# Patient Record
Sex: Female | Born: 1960 | Race: White | Hispanic: No | Marital: Single | Smoking: Never smoker
Health system: Southern US, Community
[De-identification: ages and names within clinical notes are randomized; demographics above are authoritative.]

---

## 1997-08-03 ENCOUNTER — Other Ambulatory Visit: Admission: RE | Admit: 1997-08-03 | Discharge: 1997-08-03 | Payer: Self-pay | Admitting: Dermatology

## 1997-12-10 ENCOUNTER — Other Ambulatory Visit: Admission: RE | Admit: 1997-12-10 | Discharge: 1997-12-10 | Payer: Self-pay | Admitting: *Deleted

## 1999-02-06 ENCOUNTER — Other Ambulatory Visit: Admission: RE | Admit: 1999-02-06 | Discharge: 1999-02-06 | Payer: Self-pay | Admitting: *Deleted

## 2000-01-11 ENCOUNTER — Encounter: Admission: RE | Admit: 2000-01-11 | Discharge: 2000-01-11 | Payer: Self-pay | Admitting: *Deleted

## 2000-01-11 ENCOUNTER — Encounter: Payer: Self-pay | Admitting: *Deleted

## 2000-03-08 ENCOUNTER — Other Ambulatory Visit: Admission: RE | Admit: 2000-03-08 | Discharge: 2000-03-08 | Payer: Self-pay | Admitting: *Deleted

## 2001-01-15 ENCOUNTER — Encounter: Admission: RE | Admit: 2001-01-15 | Discharge: 2001-01-15 | Payer: Self-pay | Admitting: *Deleted

## 2001-01-15 ENCOUNTER — Encounter: Payer: Self-pay | Admitting: *Deleted

## 2001-03-19 ENCOUNTER — Other Ambulatory Visit: Admission: RE | Admit: 2001-03-19 | Discharge: 2001-03-19 | Payer: Self-pay | Admitting: *Deleted

## 2002-03-04 ENCOUNTER — Encounter: Payer: Self-pay | Admitting: *Deleted

## 2002-03-04 ENCOUNTER — Encounter: Admission: RE | Admit: 2002-03-04 | Discharge: 2002-03-04 | Payer: Self-pay | Admitting: *Deleted

## 2002-03-26 ENCOUNTER — Other Ambulatory Visit: Admission: RE | Admit: 2002-03-26 | Discharge: 2002-03-26 | Payer: Self-pay | Admitting: *Deleted

## 2003-03-16 ENCOUNTER — Encounter: Admission: RE | Admit: 2003-03-16 | Discharge: 2003-03-16 | Payer: Self-pay | Admitting: *Deleted

## 2004-05-05 ENCOUNTER — Encounter: Admission: RE | Admit: 2004-05-05 | Discharge: 2004-05-05 | Payer: Self-pay | Admitting: *Deleted

## 2004-05-11 ENCOUNTER — Other Ambulatory Visit: Admission: RE | Admit: 2004-05-11 | Discharge: 2004-05-11 | Payer: Self-pay | Admitting: *Deleted

## 2005-05-31 ENCOUNTER — Other Ambulatory Visit: Admission: RE | Admit: 2005-05-31 | Discharge: 2005-05-31 | Payer: Self-pay | Admitting: Obstetrics and Gynecology

## 2005-06-18 ENCOUNTER — Encounter: Admission: RE | Admit: 2005-06-18 | Discharge: 2005-06-18 | Payer: Self-pay | Admitting: Obstetrics & Gynecology

## 2006-06-25 ENCOUNTER — Encounter: Admission: RE | Admit: 2006-06-25 | Discharge: 2006-06-25 | Payer: Self-pay | Admitting: Obstetrics & Gynecology

## 2007-07-18 ENCOUNTER — Encounter: Admission: RE | Admit: 2007-07-18 | Discharge: 2007-07-18 | Payer: Self-pay | Admitting: Obstetrics & Gynecology

## 2007-08-27 ENCOUNTER — Other Ambulatory Visit: Admission: RE | Admit: 2007-08-27 | Discharge: 2007-08-27 | Payer: Self-pay | Admitting: Obstetrics & Gynecology

## 2008-09-06 ENCOUNTER — Encounter: Admission: RE | Admit: 2008-09-06 | Discharge: 2008-09-06 | Payer: Self-pay | Admitting: Obstetrics & Gynecology

## 2008-09-10 ENCOUNTER — Encounter: Admission: RE | Admit: 2008-09-10 | Discharge: 2008-09-10 | Payer: Self-pay | Admitting: Obstetrics & Gynecology

## 2009-10-12 ENCOUNTER — Encounter: Admission: RE | Admit: 2009-10-12 | Discharge: 2009-10-12 | Payer: Self-pay | Admitting: Obstetrics & Gynecology

## 2010-10-13 ENCOUNTER — Other Ambulatory Visit: Payer: Self-pay | Admitting: Obstetrics & Gynecology

## 2010-10-13 DIAGNOSIS — Z1231 Encounter for screening mammogram for malignant neoplasm of breast: Secondary | ICD-10-CM

## 2010-10-24 ENCOUNTER — Ambulatory Visit
Admission: RE | Admit: 2010-10-24 | Discharge: 2010-10-24 | Disposition: A | Discharge status: Home / Self Care | Payer: Medicare HMO | Source: Ambulatory Visit | Attending: Obstetrics & Gynecology | Admitting: Obstetrics & Gynecology

## 2010-10-24 DIAGNOSIS — Z1231 Encounter for screening mammogram for malignant neoplasm of breast: Secondary | ICD-10-CM

## 2010-11-07 ENCOUNTER — Other Ambulatory Visit: Payer: Self-pay | Admitting: Family Medicine

## 2010-11-07 DIAGNOSIS — N6313 Unspecified lump in the right breast, lower outer quadrant: Secondary | ICD-10-CM

## 2010-11-07 DIAGNOSIS — N6320 Unspecified lump in the left breast, unspecified quadrant: Secondary | ICD-10-CM

## 2010-11-10 ENCOUNTER — Ambulatory Visit
Admission: RE | Admit: 2010-11-10 | Discharge: 2010-11-10 | Disposition: A | Discharge status: Home / Self Care | Payer: Medicare HMO | Source: Ambulatory Visit | Attending: Family Medicine | Admitting: Family Medicine

## 2010-11-10 DIAGNOSIS — N6313 Unspecified lump in the right breast, lower outer quadrant: Secondary | ICD-10-CM

## 2010-11-10 DIAGNOSIS — N6320 Unspecified lump in the left breast, unspecified quadrant: Secondary | ICD-10-CM

## 2010-11-14 ENCOUNTER — Other Ambulatory Visit (HOSPITAL_COMMUNITY)
Admission: RE | Admit: 2010-11-14 | Discharge: 2010-11-14 | Disposition: A | Discharge status: Home / Self Care | Payer: Medicare HMO | Source: Ambulatory Visit | Attending: Family Medicine | Admitting: Family Medicine

## 2010-11-14 ENCOUNTER — Other Ambulatory Visit: Payer: Self-pay | Admitting: Family Medicine

## 2010-11-14 DIAGNOSIS — Z1159 Encounter for screening for other viral diseases: Secondary | ICD-10-CM | POA: Insufficient documentation

## 2010-11-14 DIAGNOSIS — Z124 Encounter for screening for malignant neoplasm of cervix: Secondary | ICD-10-CM | POA: Insufficient documentation

## 2011-06-13 ENCOUNTER — Other Ambulatory Visit: Payer: Self-pay | Admitting: Family Medicine

## 2011-06-13 DIAGNOSIS — N6310 Unspecified lump in the right breast, unspecified quadrant: Secondary | ICD-10-CM

## 2011-06-13 DIAGNOSIS — N6002 Solitary cyst of left breast: Secondary | ICD-10-CM

## 2011-06-19 ENCOUNTER — Ambulatory Visit
Admission: RE | Admit: 2011-06-19 | Discharge: 2011-06-19 | Disposition: A | Discharge status: Home / Self Care | Payer: Medicare HMO | Source: Ambulatory Visit | Attending: Family Medicine | Admitting: Family Medicine

## 2011-06-19 ENCOUNTER — Other Ambulatory Visit: Payer: Self-pay | Admitting: Family Medicine

## 2011-06-19 DIAGNOSIS — N6310 Unspecified lump in the right breast, unspecified quadrant: Secondary | ICD-10-CM

## 2011-06-19 DIAGNOSIS — N6002 Solitary cyst of left breast: Secondary | ICD-10-CM

## 2011-09-26 ENCOUNTER — Other Ambulatory Visit: Payer: Self-pay | Admitting: Family Medicine

## 2011-09-26 DIAGNOSIS — N6009 Solitary cyst of unspecified breast: Secondary | ICD-10-CM

## 2011-10-26 ENCOUNTER — Ambulatory Visit
Admission: RE | Admit: 2011-10-26 | Discharge: 2011-10-26 | Disposition: A | Discharge status: Home / Self Care | Payer: Medicare HMO | Source: Ambulatory Visit | Attending: Family Medicine | Admitting: Family Medicine

## 2011-10-26 DIAGNOSIS — N6009 Solitary cyst of unspecified breast: Secondary | ICD-10-CM

## 2012-05-05 ENCOUNTER — Other Ambulatory Visit: Payer: Self-pay | Admitting: Gastroenterology

## 2012-09-08 ENCOUNTER — Other Ambulatory Visit: Payer: Self-pay | Admitting: Dermatology

## 2012-10-20 ENCOUNTER — Other Ambulatory Visit: Payer: Self-pay | Admitting: Gastroenterology

## 2012-10-20 DIAGNOSIS — D649 Anemia, unspecified: Secondary | ICD-10-CM

## 2012-10-20 DIAGNOSIS — R109 Unspecified abdominal pain: Secondary | ICD-10-CM

## 2012-10-27 ENCOUNTER — Ambulatory Visit
Admission: RE | Admit: 2012-10-27 | Discharge: 2012-10-27 | Disposition: A | Discharge status: Home / Self Care | Payer: Medicare HMO | Source: Ambulatory Visit | Attending: Gastroenterology | Admitting: Gastroenterology

## 2012-10-27 DIAGNOSIS — R109 Unspecified abdominal pain: Secondary | ICD-10-CM

## 2012-10-27 DIAGNOSIS — D649 Anemia, unspecified: Secondary | ICD-10-CM

## 2012-10-27 MED ORDER — IOHEXOL 300 MG/ML  SOLN
100.0000 mL | Freq: Once | INTRAMUSCULAR | Status: AC | PRN
  Administered 2012-10-27: 100 mL via INTRAVENOUS

## 2012-11-12 ENCOUNTER — Other Ambulatory Visit: Payer: Self-pay | Admitting: Obstetrics and Gynecology

## 2012-11-12 DIAGNOSIS — D219 Benign neoplasm of connective and other soft tissue, unspecified: Secondary | ICD-10-CM

## 2012-11-18 ENCOUNTER — Other Ambulatory Visit: Payer: Medicare HMO

## 2012-11-23 ENCOUNTER — Ambulatory Visit
Admission: RE | Admit: 2012-11-23 | Discharge: 2012-11-23 | Disposition: A | Discharge status: Home / Self Care | Payer: Medicare HMO | Source: Ambulatory Visit | Attending: Obstetrics and Gynecology | Admitting: Obstetrics and Gynecology

## 2012-11-23 DIAGNOSIS — D219 Benign neoplasm of connective and other soft tissue, unspecified: Secondary | ICD-10-CM

## 2012-11-23 MED ORDER — GADOBENATE DIMEGLUMINE 529 MG/ML IV SOLN
14.0000 mL | Freq: Once | INTRAVENOUS | Status: AC | PRN
  Administered 2012-11-23: 14 mL via INTRAVENOUS

## 2012-12-17 ENCOUNTER — Other Ambulatory Visit: Payer: Self-pay

## 2012-12-17 DIAGNOSIS — Z1231 Encounter for screening mammogram for malignant neoplasm of breast: Secondary | ICD-10-CM

## 2012-12-23 ENCOUNTER — Other Ambulatory Visit: Payer: Self-pay | Admitting: Obstetrics and Gynecology

## 2012-12-23 DIAGNOSIS — D259 Leiomyoma of uterus, unspecified: Secondary | ICD-10-CM

## 2012-12-24 ENCOUNTER — Ambulatory Visit
Admission: RE | Admit: 2012-12-24 | Discharge: 2012-12-24 | Disposition: A | Discharge status: Home / Self Care | Payer: Medicare HMO | Source: Ambulatory Visit | Attending: Obstetrics and Gynecology | Admitting: Obstetrics and Gynecology

## 2012-12-24 DIAGNOSIS — D259 Leiomyoma of uterus, unspecified: Secondary | ICD-10-CM

## 2013-01-16 ENCOUNTER — Ambulatory Visit: Payer: Medicare HMO

## 2013-01-19 ENCOUNTER — Ambulatory Visit
Admission: RE | Admit: 2013-01-19 | Discharge: 2013-01-19 | Disposition: A | Discharge status: Home / Self Care | Payer: Self-pay | Source: Ambulatory Visit

## 2013-01-19 DIAGNOSIS — Z1231 Encounter for screening mammogram for malignant neoplasm of breast: Secondary | ICD-10-CM

## 2013-11-27 ENCOUNTER — Other Ambulatory Visit: Payer: Self-pay | Admitting: Obstetrics and Gynecology

## 2013-11-27 ENCOUNTER — Other Ambulatory Visit (HOSPITAL_COMMUNITY)
Admission: RE | Admit: 2013-11-27 | Discharge: 2013-11-27 | Disposition: A | Discharge status: Home / Self Care | Payer: Medicare HMO | Source: Ambulatory Visit | Attending: Obstetrics and Gynecology | Admitting: Obstetrics and Gynecology

## 2013-11-27 DIAGNOSIS — Z124 Encounter for screening for malignant neoplasm of cervix: Secondary | ICD-10-CM | POA: Insufficient documentation

## 2013-11-27 DIAGNOSIS — Z1151 Encounter for screening for human papillomavirus (HPV): Secondary | ICD-10-CM | POA: Insufficient documentation

## 2013-12-01 LAB — CYTOLOGY - PAP

## 2013-12-21 ENCOUNTER — Other Ambulatory Visit: Payer: Self-pay | Admitting: Obstetrics and Gynecology

## 2013-12-21 DIAGNOSIS — N83202 Unspecified ovarian cyst, left side: Secondary | ICD-10-CM

## 2013-12-21 DIAGNOSIS — D250 Submucous leiomyoma of uterus: Secondary | ICD-10-CM

## 2013-12-26 ENCOUNTER — Ambulatory Visit
Admission: RE | Admit: 2013-12-26 | Discharge: 2013-12-26 | Disposition: A | Discharge status: Home / Self Care | Payer: Medicare HMO | Source: Ambulatory Visit | Attending: Obstetrics and Gynecology | Admitting: Obstetrics and Gynecology

## 2013-12-26 DIAGNOSIS — D250 Submucous leiomyoma of uterus: Secondary | ICD-10-CM

## 2013-12-26 DIAGNOSIS — N83202 Unspecified ovarian cyst, left side: Secondary | ICD-10-CM

## 2013-12-26 MED ORDER — GADOBENATE DIMEGLUMINE 529 MG/ML IV SOLN
14.0000 mL | Freq: Once | INTRAVENOUS | Status: AC | PRN
  Administered 2013-12-26: 14 mL via INTRAVENOUS

## 2014-01-15 ENCOUNTER — Other Ambulatory Visit: Payer: Self-pay

## 2014-01-15 DIAGNOSIS — Z1231 Encounter for screening mammogram for malignant neoplasm of breast: Secondary | ICD-10-CM

## 2014-02-17 ENCOUNTER — Encounter (INDEPENDENT_AMBULATORY_CARE_PROVIDER_SITE_OTHER): Payer: Self-pay

## 2014-02-17 ENCOUNTER — Ambulatory Visit
Admission: RE | Admit: 2014-02-17 | Discharge: 2014-02-17 | Disposition: A | Discharge status: Home / Self Care | Payer: Commercial Managed Care - HMO | Source: Ambulatory Visit

## 2014-02-17 DIAGNOSIS — Z1231 Encounter for screening mammogram for malignant neoplasm of breast: Secondary | ICD-10-CM

## 2014-04-28 DIAGNOSIS — H1013 Acute atopic conjunctivitis, bilateral: Secondary | ICD-10-CM | POA: Diagnosis not present

## 2014-11-29 ENCOUNTER — Other Ambulatory Visit: Payer: Self-pay | Admitting: Obstetrics and Gynecology

## 2014-11-29 DIAGNOSIS — N63 Unspecified lump in breast: Secondary | ICD-10-CM | POA: Diagnosis not present

## 2014-11-29 DIAGNOSIS — N6320 Unspecified lump in the left breast, unspecified quadrant: Secondary | ICD-10-CM

## 2014-11-29 DIAGNOSIS — Z01411 Encounter for gynecological examination (general) (routine) with abnormal findings: Secondary | ICD-10-CM | POA: Diagnosis not present

## 2014-11-29 DIAGNOSIS — D259 Leiomyoma of uterus, unspecified: Secondary | ICD-10-CM | POA: Diagnosis not present

## 2014-12-03 ENCOUNTER — Ambulatory Visit
Admission: RE | Admit: 2014-12-03 | Discharge: 2014-12-03 | Disposition: A | Discharge status: Home / Self Care | Payer: Commercial Managed Care - HMO | Source: Ambulatory Visit | Attending: Obstetrics and Gynecology | Admitting: Obstetrics and Gynecology

## 2014-12-03 DIAGNOSIS — N6320 Unspecified lump in the left breast, unspecified quadrant: Secondary | ICD-10-CM

## 2014-12-03 DIAGNOSIS — N6012 Diffuse cystic mastopathy of left breast: Secondary | ICD-10-CM | POA: Diagnosis not present

## 2014-12-07 DIAGNOSIS — D259 Leiomyoma of uterus, unspecified: Secondary | ICD-10-CM | POA: Diagnosis not present

## 2015-02-18 ENCOUNTER — Other Ambulatory Visit: Payer: Self-pay

## 2015-02-18 ENCOUNTER — Other Ambulatory Visit: Payer: Self-pay | Admitting: Obstetrics and Gynecology

## 2015-02-18 DIAGNOSIS — Z1231 Encounter for screening mammogram for malignant neoplasm of breast: Secondary | ICD-10-CM

## 2015-03-25 ENCOUNTER — Ambulatory Visit: Payer: Commercial Managed Care - HMO

## 2015-04-22 ENCOUNTER — Ambulatory Visit
Admission: RE | Admit: 2015-04-22 | Discharge: 2015-04-22 | Disposition: A | Discharge status: Home / Self Care | Payer: Commercial Managed Care - HMO | Source: Ambulatory Visit

## 2015-04-22 DIAGNOSIS — Z1231 Encounter for screening mammogram for malignant neoplasm of breast: Secondary | ICD-10-CM | POA: Diagnosis not present

## 2015-09-16 DIAGNOSIS — N751 Abscess of Bartholin's gland: Secondary | ICD-10-CM | POA: Diagnosis not present

## 2015-09-23 IMAGING — MG MM SCREEN MAMMOGRAM BILATERAL
6 series · 6 of 6 positions shown · non-contrast
Comparison: Previous exam(s).

CLINICAL DATA: Screening.

EXAM:
DIGITAL SCREENING BILATERAL MAMMOGRAM WITH CAD

[R CC (1 of 2)]
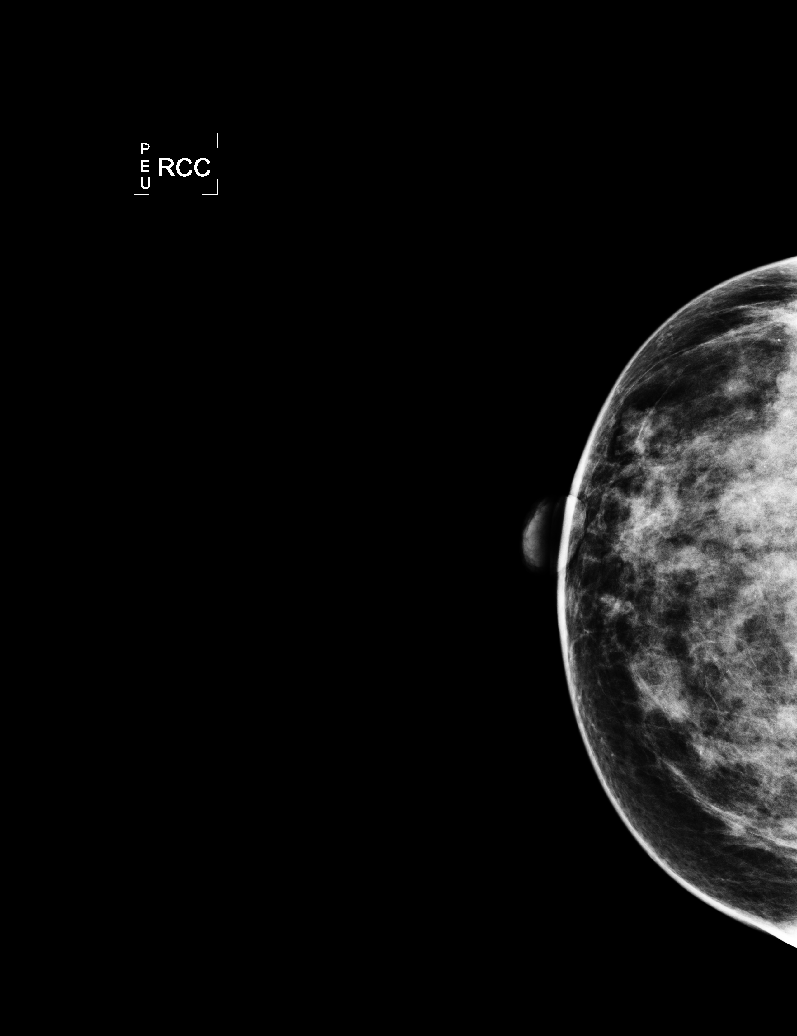

[L CC]
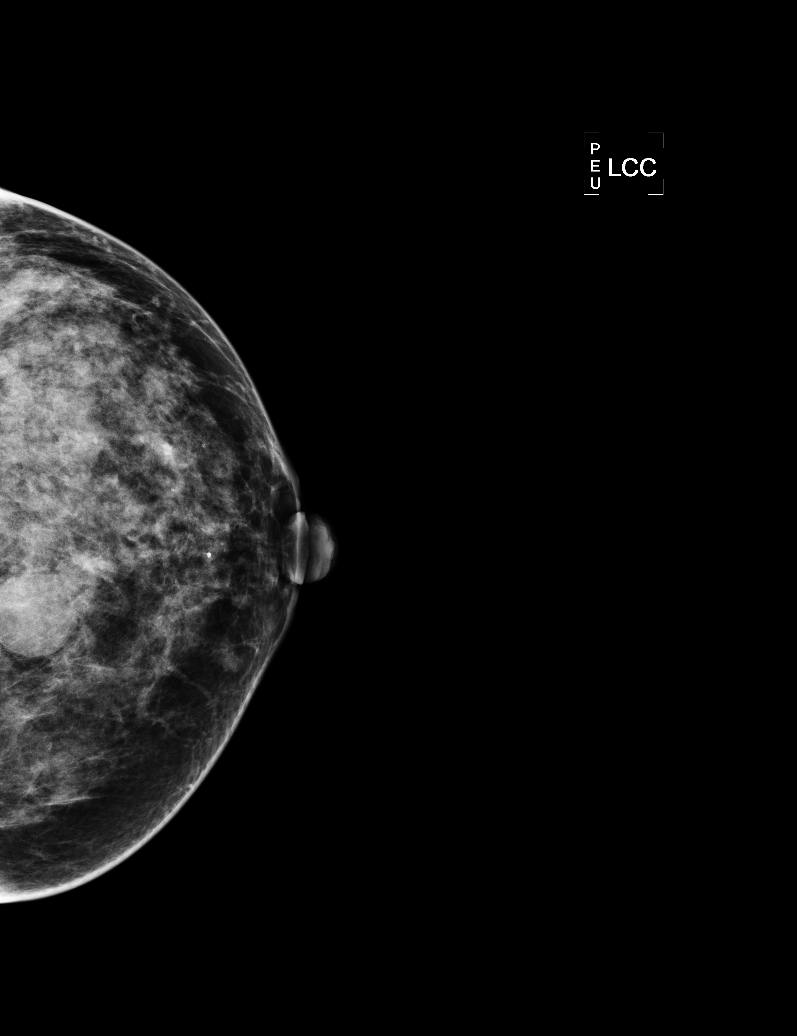

[L MLO (1 of 2)]
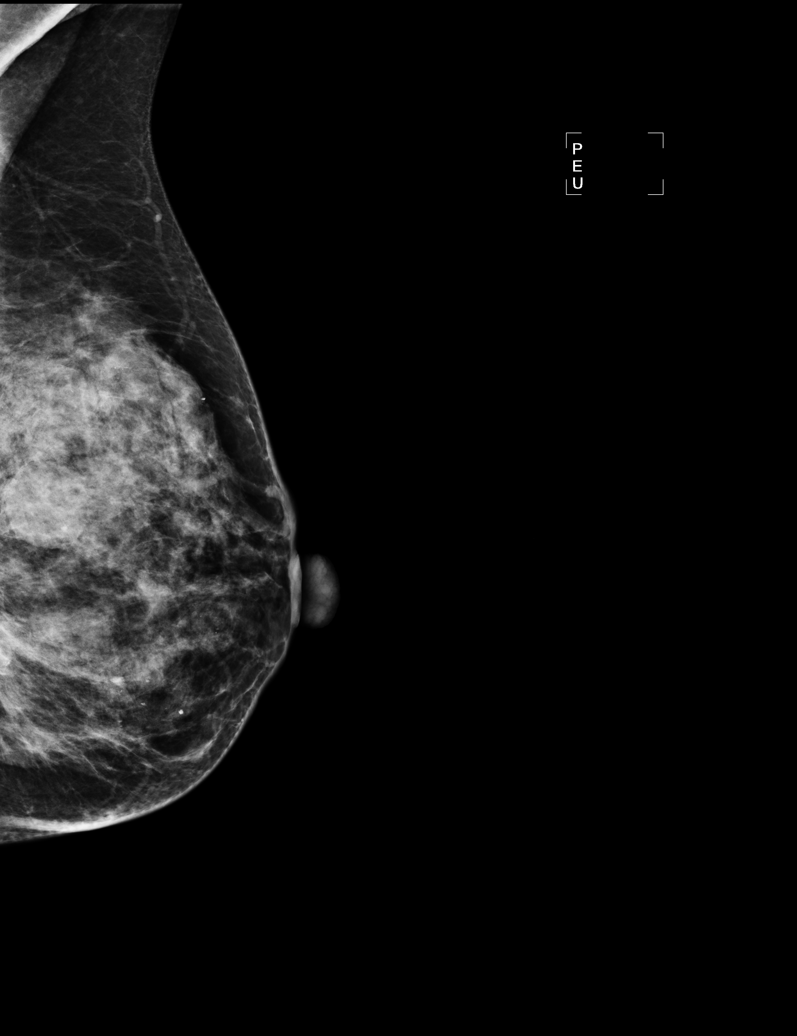

[R MLO]
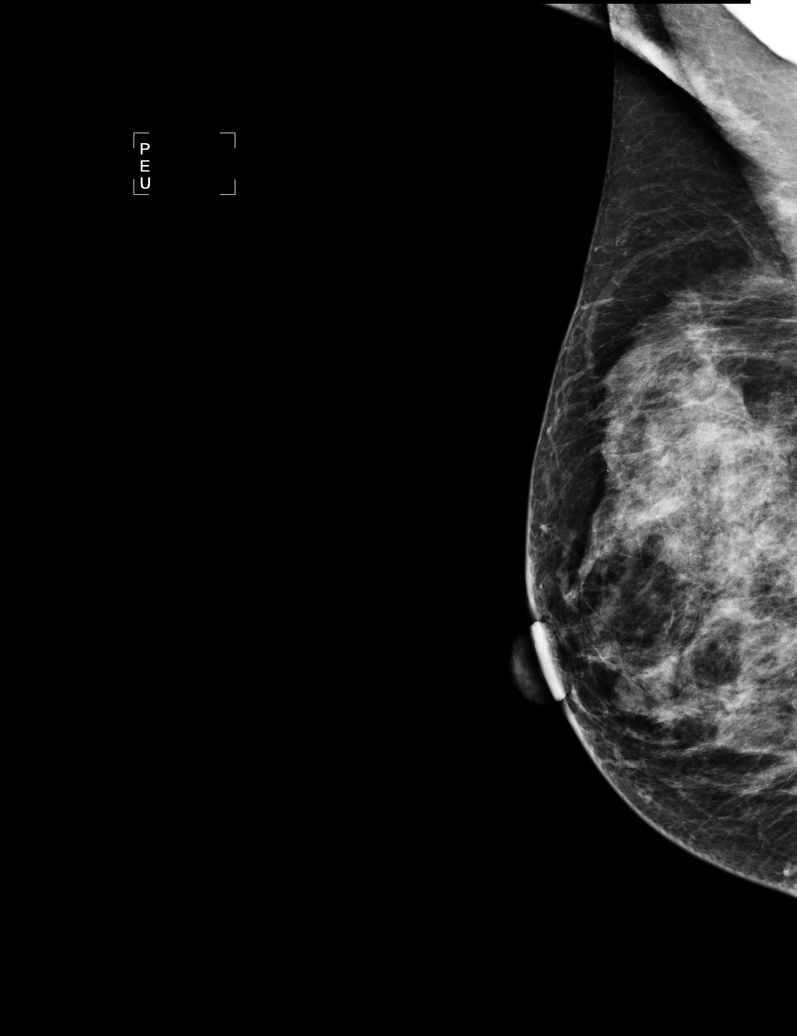

[R CC (2 of 2)]
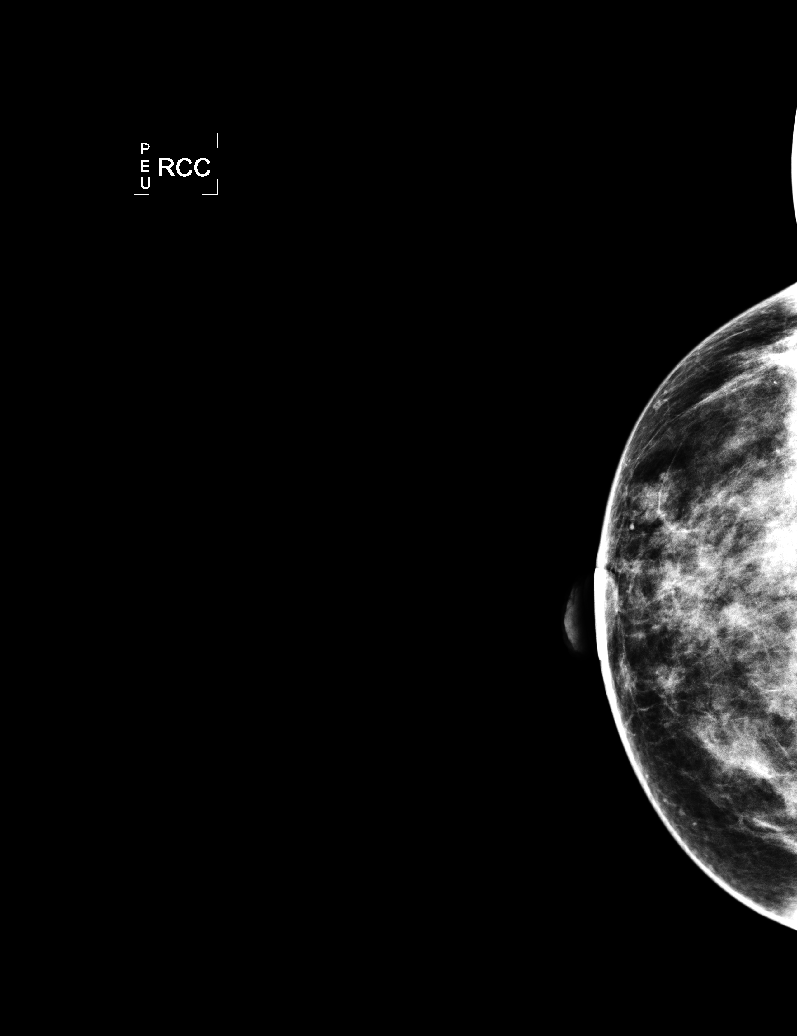

[L MLO (2 of 2)]
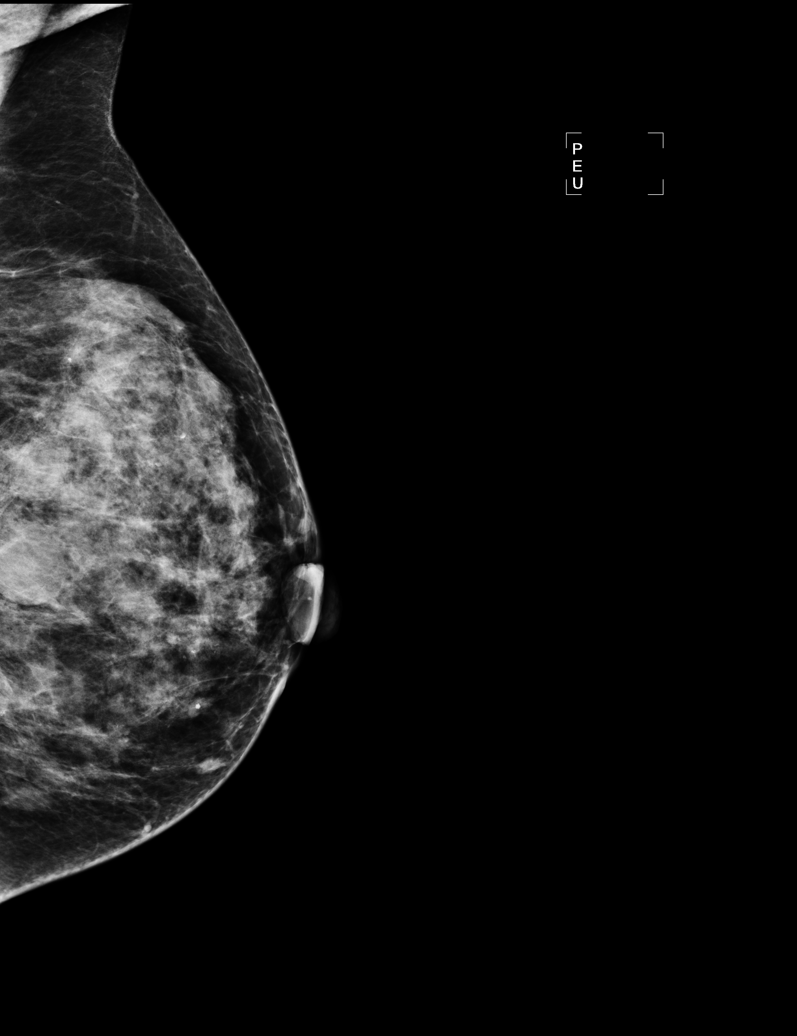

[6 of 6 positions shown; findings below may reference images not displayed]

ACR Breast Density Category c: The breasts are heterogeneously
dense, which may obscure small masses.
FINDINGS: There are no findings suspicious for malignancy. Images were
processed with CAD.
IMPRESSION: No mammographic evidence of malignancy. A result letter of this
screening mammogram will be mailed directly to the patient.

RECOMMENDATION:
Screening mammogram in one year. (Code:[id number]-8X6)

BI-RADS CATEGORY  1: Negative

## 2015-12-02 DIAGNOSIS — E559 Vitamin D deficiency, unspecified: Secondary | ICD-10-CM | POA: Diagnosis not present

## 2015-12-02 DIAGNOSIS — R21 Rash and other nonspecific skin eruption: Secondary | ICD-10-CM | POA: Diagnosis not present

## 2015-12-02 DIAGNOSIS — N951 Menopausal and female climacteric states: Secondary | ICD-10-CM | POA: Diagnosis not present

## 2015-12-02 DIAGNOSIS — D251 Intramural leiomyoma of uterus: Secondary | ICD-10-CM | POA: Diagnosis not present

## 2015-12-02 DIAGNOSIS — Z01411 Encounter for gynecological examination (general) (routine) with abnormal findings: Secondary | ICD-10-CM | POA: Diagnosis not present

## 2015-12-02 DIAGNOSIS — D252 Subserosal leiomyoma of uterus: Secondary | ICD-10-CM | POA: Diagnosis not present

## 2015-12-21 DIAGNOSIS — F840 Autistic disorder: Secondary | ICD-10-CM | POA: Diagnosis not present

## 2015-12-21 DIAGNOSIS — E559 Vitamin D deficiency, unspecified: Secondary | ICD-10-CM | POA: Diagnosis not present

## 2015-12-21 DIAGNOSIS — E7800 Pure hypercholesterolemia, unspecified: Secondary | ICD-10-CM | POA: Diagnosis not present

## 2015-12-28 ENCOUNTER — Other Ambulatory Visit: Payer: Self-pay | Admitting: Dermatology

## 2015-12-28 DIAGNOSIS — D235 Other benign neoplasm of skin of trunk: Secondary | ICD-10-CM | POA: Diagnosis not present

## 2015-12-28 DIAGNOSIS — D239 Other benign neoplasm of skin, unspecified: Secondary | ICD-10-CM | POA: Diagnosis not present

## 2015-12-28 DIAGNOSIS — L918 Other hypertrophic disorders of the skin: Secondary | ICD-10-CM | POA: Diagnosis not present

## 2015-12-28 DIAGNOSIS — D485 Neoplasm of uncertain behavior of skin: Secondary | ICD-10-CM | POA: Diagnosis not present

## 2016-02-24 DIAGNOSIS — E782 Mixed hyperlipidemia: Secondary | ICD-10-CM | POA: Diagnosis not present

## 2016-04-05 ENCOUNTER — Other Ambulatory Visit: Payer: Self-pay | Admitting: Obstetrics and Gynecology

## 2016-04-05 DIAGNOSIS — Z1231 Encounter for screening mammogram for malignant neoplasm of breast: Secondary | ICD-10-CM

## 2016-04-06 DIAGNOSIS — E559 Vitamin D deficiency, unspecified: Secondary | ICD-10-CM | POA: Diagnosis not present

## 2016-04-30 ENCOUNTER — Ambulatory Visit: Payer: Commercial Managed Care - HMO

## 2016-04-30 DIAGNOSIS — H10413 Chronic giant papillary conjunctivitis, bilateral: Secondary | ICD-10-CM | POA: Diagnosis not present

## 2016-05-02 ENCOUNTER — Ambulatory Visit
Admission: RE | Admit: 2016-05-02 | Discharge: 2016-05-02 | Disposition: A | Discharge status: Home / Self Care | Payer: Commercial Managed Care - HMO | Source: Ambulatory Visit | Attending: Obstetrics and Gynecology | Admitting: Obstetrics and Gynecology

## 2016-05-02 DIAGNOSIS — Z1231 Encounter for screening mammogram for malignant neoplasm of breast: Secondary | ICD-10-CM | POA: Diagnosis not present

## 2016-07-16 DIAGNOSIS — R69 Illness, unspecified: Secondary | ICD-10-CM | POA: Diagnosis not present

## 2016-12-04 ENCOUNTER — Other Ambulatory Visit: Payer: Self-pay | Admitting: Obstetrics and Gynecology

## 2016-12-04 ENCOUNTER — Other Ambulatory Visit (HOSPITAL_COMMUNITY)
Admission: RE | Admit: 2016-12-04 | Discharge: 2016-12-04 | Disposition: A | Discharge status: Home / Self Care | Payer: Medicare HMO | Source: Ambulatory Visit | Attending: Obstetrics and Gynecology | Admitting: Obstetrics and Gynecology

## 2016-12-04 DIAGNOSIS — Z01411 Encounter for gynecological examination (general) (routine) with abnormal findings: Secondary | ICD-10-CM | POA: Diagnosis not present

## 2016-12-04 DIAGNOSIS — Z124 Encounter for screening for malignant neoplasm of cervix: Secondary | ICD-10-CM | POA: Insufficient documentation

## 2016-12-04 DIAGNOSIS — N6320 Unspecified lump in the left breast, unspecified quadrant: Secondary | ICD-10-CM

## 2016-12-04 DIAGNOSIS — D252 Subserosal leiomyoma of uterus: Secondary | ICD-10-CM | POA: Diagnosis not present

## 2016-12-04 DIAGNOSIS — D251 Intramural leiomyoma of uterus: Secondary | ICD-10-CM | POA: Diagnosis not present

## 2016-12-04 DIAGNOSIS — N951 Menopausal and female climacteric states: Secondary | ICD-10-CM | POA: Diagnosis not present

## 2016-12-04 DIAGNOSIS — E559 Vitamin D deficiency, unspecified: Secondary | ICD-10-CM | POA: Diagnosis not present

## 2016-12-06 ENCOUNTER — Ambulatory Visit
Admission: RE | Admit: 2016-12-06 | Discharge: 2016-12-06 | Disposition: A | Discharge status: Home / Self Care | Payer: Medicare HMO | Source: Ambulatory Visit | Attending: Obstetrics and Gynecology | Admitting: Obstetrics and Gynecology

## 2016-12-06 ENCOUNTER — Other Ambulatory Visit: Payer: Commercial Managed Care - HMO

## 2016-12-06 DIAGNOSIS — N6321 Unspecified lump in the left breast, upper outer quadrant: Secondary | ICD-10-CM | POA: Diagnosis not present

## 2016-12-06 DIAGNOSIS — N6320 Unspecified lump in the left breast, unspecified quadrant: Secondary | ICD-10-CM

## 2016-12-06 DIAGNOSIS — R922 Inconclusive mammogram: Secondary | ICD-10-CM | POA: Diagnosis not present

## 2016-12-06 LAB — CYTOLOGY - PAP
Diagnosis: NEGATIVE
HPV: NOT DETECTED

## 2017-01-23 DIAGNOSIS — Z780 Asymptomatic menopausal state: Secondary | ICD-10-CM | POA: Diagnosis not present

## 2017-04-02 ENCOUNTER — Other Ambulatory Visit: Payer: Self-pay | Admitting: Obstetrics and Gynecology

## 2017-04-02 DIAGNOSIS — Z1231 Encounter for screening mammogram for malignant neoplasm of breast: Secondary | ICD-10-CM

## 2017-04-08 DIAGNOSIS — S4992XA Unspecified injury of left shoulder and upper arm, initial encounter: Secondary | ICD-10-CM | POA: Diagnosis not present

## 2017-05-03 ENCOUNTER — Ambulatory Visit: Payer: Medicare HMO

## 2017-05-17 ENCOUNTER — Ambulatory Visit
Admission: RE | Admit: 2017-05-17 | Discharge: 2017-05-17 | Disposition: A | Discharge status: Home / Self Care | Payer: Medicare HMO | Source: Ambulatory Visit | Attending: Obstetrics and Gynecology | Admitting: Obstetrics and Gynecology

## 2017-05-17 DIAGNOSIS — Z1231 Encounter for screening mammogram for malignant neoplasm of breast: Secondary | ICD-10-CM | POA: Diagnosis not present

## 2017-10-16 DIAGNOSIS — Z86010 Personal history of colonic polyps: Secondary | ICD-10-CM | POA: Diagnosis not present

## 2017-12-13 DIAGNOSIS — N904 Leukoplakia of vulva: Secondary | ICD-10-CM | POA: Diagnosis not present

## 2017-12-13 DIAGNOSIS — Z01411 Encounter for gynecological examination (general) (routine) with abnormal findings: Secondary | ICD-10-CM | POA: Diagnosis not present

## 2017-12-13 DIAGNOSIS — N898 Other specified noninflammatory disorders of vagina: Secondary | ICD-10-CM | POA: Diagnosis not present

## 2017-12-30 ENCOUNTER — Other Ambulatory Visit: Payer: Self-pay | Admitting: Obstetrics and Gynecology

## 2017-12-30 DIAGNOSIS — N904 Leukoplakia of vulva: Secondary | ICD-10-CM | POA: Diagnosis not present

## 2018-02-04 DIAGNOSIS — E7800 Pure hypercholesterolemia, unspecified: Secondary | ICD-10-CM | POA: Diagnosis not present

## 2018-02-04 DIAGNOSIS — Z79899 Other long term (current) drug therapy: Secondary | ICD-10-CM | POA: Diagnosis not present

## 2018-03-11 DIAGNOSIS — L900 Lichen sclerosus et atrophicus: Secondary | ICD-10-CM | POA: Diagnosis not present

## 2018-04-07 DIAGNOSIS — E7800 Pure hypercholesterolemia, unspecified: Secondary | ICD-10-CM | POA: Diagnosis not present

## 2018-04-07 DIAGNOSIS — Z1159 Encounter for screening for other viral diseases: Secondary | ICD-10-CM | POA: Diagnosis not present

## 2018-04-07 DIAGNOSIS — Z0000 Encounter for general adult medical examination without abnormal findings: Secondary | ICD-10-CM | POA: Diagnosis not present

## 2018-04-15 ENCOUNTER — Other Ambulatory Visit: Payer: Self-pay | Admitting: Obstetrics and Gynecology

## 2018-04-15 DIAGNOSIS — Z1231 Encounter for screening mammogram for malignant neoplasm of breast: Secondary | ICD-10-CM

## 2018-05-01 DIAGNOSIS — H524 Presbyopia: Secondary | ICD-10-CM | POA: Diagnosis not present

## 2018-05-01 DIAGNOSIS — H10413 Chronic giant papillary conjunctivitis, bilateral: Secondary | ICD-10-CM | POA: Diagnosis not present

## 2018-05-19 ENCOUNTER — Ambulatory Visit
Admission: RE | Admit: 2018-05-19 | Discharge: 2018-05-19 | Disposition: A | Discharge status: Home / Self Care | Payer: Medicare HMO | Source: Ambulatory Visit | Attending: Obstetrics and Gynecology | Admitting: Obstetrics and Gynecology

## 2018-05-19 DIAGNOSIS — Z1231 Encounter for screening mammogram for malignant neoplasm of breast: Secondary | ICD-10-CM | POA: Diagnosis not present

## 2018-06-02 DIAGNOSIS — L900 Lichen sclerosus et atrophicus: Secondary | ICD-10-CM | POA: Diagnosis not present

## 2018-10-06 DIAGNOSIS — Z1159 Encounter for screening for other viral diseases: Secondary | ICD-10-CM | POA: Diagnosis not present

## 2018-10-06 DIAGNOSIS — E7800 Pure hypercholesterolemia, unspecified: Secondary | ICD-10-CM | POA: Diagnosis not present

## 2018-12-17 DIAGNOSIS — E559 Vitamin D deficiency, unspecified: Secondary | ICD-10-CM | POA: Diagnosis not present

## 2018-12-17 DIAGNOSIS — Z01419 Encounter for gynecological examination (general) (routine) without abnormal findings: Secondary | ICD-10-CM | POA: Diagnosis not present

## 2018-12-17 DIAGNOSIS — R030 Elevated blood-pressure reading, without diagnosis of hypertension: Secondary | ICD-10-CM | POA: Diagnosis not present

## 2019-04-01 ENCOUNTER — Ambulatory Visit: Payer: Medicare HMO | Attending: Internal Medicine

## 2019-04-01 DIAGNOSIS — Z23 Encounter for immunization: Secondary | ICD-10-CM

## 2019-04-01 NOTE — Progress Notes (Signed)
   Covid-19 Vaccination Clinic  Name:  SERAYA OHMANN    MRN: [id number] DOB: Aug 26, 1960  04/01/2019  Ms. Norrick was observed post Covid-19 immunization for 15 minutes without incidence. She was provided with Vaccine Information Sheet and instruction to access the V-Safe system.   Ms. Hiracheta was instructed to call 911 with any severe reactions post vaccine: Marland Kitchen Difficulty breathing  . Swelling of your face and throat  . A fast heartbeat  . A bad rash all over your body  . Dizziness and weakness    Immunizations Administered    Name Date Dose VIS Date Route   Pfizer COVID-19 Vaccine 04/01/2019 11:51 AM 0.3 mL 02/20/2019 Intramuscular   Manufacturer: [place]   Lot: [id number]   [place]: [id number]

## 2019-04-14 DIAGNOSIS — E7800 Pure hypercholesterolemia, unspecified: Secondary | ICD-10-CM | POA: Diagnosis not present

## 2019-04-14 DIAGNOSIS — F840 Autistic disorder: Secondary | ICD-10-CM | POA: Diagnosis not present

## 2019-04-14 DIAGNOSIS — Z0000 Encounter for general adult medical examination without abnormal findings: Secondary | ICD-10-CM | POA: Diagnosis not present

## 2019-04-16 ENCOUNTER — Other Ambulatory Visit: Payer: Self-pay | Admitting: Obstetrics and Gynecology

## 2019-04-16 DIAGNOSIS — Z1231 Encounter for screening mammogram for malignant neoplasm of breast: Secondary | ICD-10-CM

## 2019-04-22 ENCOUNTER — Ambulatory Visit: Payer: Medicare HMO | Attending: Internal Medicine

## 2019-04-22 DIAGNOSIS — Z23 Encounter for immunization: Secondary | ICD-10-CM | POA: Insufficient documentation

## 2019-04-22 NOTE — Progress Notes (Signed)
   Covid-19 Vaccination Clinic  Name:  Julie Ortega    MRN: [id number] DOB: August 16, 1960  04/22/2019  [name] was observed post Covid-19 immunization for 15 minutes without incidence. She was provided with Vaccine Information Sheet and instruction to access the V-Safe system.   [name] was instructed to call 911 with any severe reactions post vaccine: Marland Kitchen Difficulty breathing  . Swelling of your face and throat  . A fast heartbeat  . A bad rash all over your body  . Dizziness and weakness    Immunizations Administered    Name Date Dose VIS Date Route   Pfizer COVID-19 Vaccine 04/22/2019  4:34 PM 0.3 mL 02/20/2019 Intramuscular   Manufacturer: [place]   Lot: [id number]   [place]: [id number]

## 2019-05-28 ENCOUNTER — Other Ambulatory Visit: Payer: Self-pay

## 2019-05-28 ENCOUNTER — Ambulatory Visit
Admission: RE | Admit: 2019-05-28 | Discharge: 2019-05-28 | Disposition: A | Discharge status: Home / Self Care | Payer: Medicare HMO | Source: Ambulatory Visit | Attending: Obstetrics and Gynecology | Admitting: Obstetrics and Gynecology

## 2019-05-28 DIAGNOSIS — Z1231 Encounter for screening mammogram for malignant neoplasm of breast: Secondary | ICD-10-CM | POA: Diagnosis not present

## 2019-06-30 DIAGNOSIS — E7800 Pure hypercholesterolemia, unspecified: Secondary | ICD-10-CM | POA: Diagnosis not present

## 2019-06-30 DIAGNOSIS — F840 Autistic disorder: Secondary | ICD-10-CM | POA: Diagnosis not present

## 2019-06-30 DIAGNOSIS — Z0000 Encounter for general adult medical examination without abnormal findings: Secondary | ICD-10-CM | POA: Diagnosis not present

## 2020-01-06 DIAGNOSIS — Z0000 Encounter for general adult medical examination without abnormal findings: Secondary | ICD-10-CM | POA: Diagnosis not present

## 2020-01-06 DIAGNOSIS — Z124 Encounter for screening for malignant neoplasm of cervix: Secondary | ICD-10-CM | POA: Diagnosis not present

## 2020-01-06 DIAGNOSIS — M8580 Other specified disorders of bone density and structure, unspecified site: Secondary | ICD-10-CM | POA: Diagnosis not present

## 2020-01-06 DIAGNOSIS — N852 Hypertrophy of uterus: Secondary | ICD-10-CM | POA: Diagnosis not present

## 2020-01-12 ENCOUNTER — Other Ambulatory Visit: Payer: Self-pay | Admitting: Obstetrics and Gynecology

## 2020-01-12 DIAGNOSIS — M8580 Other specified disorders of bone density and structure, unspecified site: Secondary | ICD-10-CM

## 2020-01-28 DIAGNOSIS — D259 Leiomyoma of uterus, unspecified: Secondary | ICD-10-CM | POA: Diagnosis not present

## 2020-01-28 DIAGNOSIS — N852 Hypertrophy of uterus: Secondary | ICD-10-CM | POA: Diagnosis not present

## 2020-02-18 DIAGNOSIS — Z23 Encounter for immunization: Secondary | ICD-10-CM | POA: Diagnosis not present

## 2020-03-30 ENCOUNTER — Other Ambulatory Visit: Payer: Self-pay | Admitting: Obstetrics and Gynecology

## 2020-03-30 DIAGNOSIS — Z1231 Encounter for screening mammogram for malignant neoplasm of breast: Secondary | ICD-10-CM

## 2020-04-20 ENCOUNTER — Other Ambulatory Visit: Payer: Medicare HMO

## 2020-04-28 ENCOUNTER — Other Ambulatory Visit: Payer: Medicare HMO

## 2020-05-02 DIAGNOSIS — H10413 Chronic giant papillary conjunctivitis, bilateral: Secondary | ICD-10-CM | POA: Diagnosis not present

## 2020-05-02 DIAGNOSIS — H524 Presbyopia: Secondary | ICD-10-CM | POA: Diagnosis not present

## 2020-05-07 ENCOUNTER — Ambulatory Visit
Admission: RE | Admit: 2020-05-07 | Discharge: 2020-05-07 | Disposition: A | Discharge status: Home / Self Care | Payer: Medicare HMO | Source: Ambulatory Visit | Attending: Obstetrics and Gynecology | Admitting: Obstetrics and Gynecology

## 2020-05-07 ENCOUNTER — Other Ambulatory Visit: Payer: Self-pay

## 2020-05-07 DIAGNOSIS — M8589 Other specified disorders of bone density and structure, multiple sites: Secondary | ICD-10-CM | POA: Diagnosis not present

## 2020-05-07 DIAGNOSIS — Z780 Asymptomatic menopausal state: Secondary | ICD-10-CM | POA: Diagnosis not present

## 2020-05-07 DIAGNOSIS — M8580 Other specified disorders of bone density and structure, unspecified site: Secondary | ICD-10-CM

## 2020-05-30 ENCOUNTER — Inpatient Hospital Stay: Admission: RE | Admit: 2020-05-30 | Payer: Medicare HMO | Source: Ambulatory Visit

## 2020-07-21 ENCOUNTER — Other Ambulatory Visit: Payer: Self-pay

## 2020-07-21 ENCOUNTER — Ambulatory Visit
Admission: RE | Admit: 2020-07-21 | Discharge: 2020-07-21 | Disposition: A | Discharge status: Home / Self Care | Payer: Medicare HMO | Source: Ambulatory Visit | Attending: Obstetrics and Gynecology | Admitting: Obstetrics and Gynecology

## 2020-07-21 DIAGNOSIS — Z1231 Encounter for screening mammogram for malignant neoplasm of breast: Secondary | ICD-10-CM

## 2020-10-11 DIAGNOSIS — F840 Autistic disorder: Secondary | ICD-10-CM | POA: Diagnosis not present

## 2020-10-11 DIAGNOSIS — R030 Elevated blood-pressure reading, without diagnosis of hypertension: Secondary | ICD-10-CM | POA: Diagnosis not present

## 2020-10-11 DIAGNOSIS — E7800 Pure hypercholesterolemia, unspecified: Secondary | ICD-10-CM | POA: Diagnosis not present

## 2020-10-13 DIAGNOSIS — E7800 Pure hypercholesterolemia, unspecified: Secondary | ICD-10-CM | POA: Diagnosis not present

## 2020-10-13 DIAGNOSIS — R030 Elevated blood-pressure reading, without diagnosis of hypertension: Secondary | ICD-10-CM | POA: Diagnosis not present

## 2021-01-24 DIAGNOSIS — D229 Melanocytic nevi, unspecified: Secondary | ICD-10-CM | POA: Diagnosis not present

## 2021-01-24 DIAGNOSIS — L820 Inflamed seborrheic keratosis: Secondary | ICD-10-CM | POA: Diagnosis not present

## 2021-01-24 DIAGNOSIS — D1801 Hemangioma of skin and subcutaneous tissue: Secondary | ICD-10-CM | POA: Diagnosis not present

## 2021-01-24 DIAGNOSIS — L821 Other seborrheic keratosis: Secondary | ICD-10-CM | POA: Diagnosis not present

## 2021-05-09 DIAGNOSIS — H5203 Hypermetropia, bilateral: Secondary | ICD-10-CM | POA: Diagnosis not present

## 2021-05-09 DIAGNOSIS — H524 Presbyopia: Secondary | ICD-10-CM | POA: Diagnosis not present

## 2021-05-09 DIAGNOSIS — H10413 Chronic giant papillary conjunctivitis, bilateral: Secondary | ICD-10-CM | POA: Diagnosis not present

## 2021-05-16 DIAGNOSIS — Z01419 Encounter for gynecological examination (general) (routine) without abnormal findings: Secondary | ICD-10-CM | POA: Diagnosis not present

## 2021-05-16 DIAGNOSIS — M8580 Other specified disorders of bone density and structure, unspecified site: Secondary | ICD-10-CM | POA: Diagnosis not present

## 2021-07-04 ENCOUNTER — Other Ambulatory Visit: Payer: Self-pay | Admitting: Obstetrics and Gynecology

## 2021-07-04 DIAGNOSIS — Z1231 Encounter for screening mammogram for malignant neoplasm of breast: Secondary | ICD-10-CM

## 2021-07-25 ENCOUNTER — Ambulatory Visit
Admission: RE | Admit: 2021-07-25 | Discharge: 2021-07-25 | Disposition: A | Discharge status: Home / Self Care | Payer: Medicare HMO | Source: Ambulatory Visit | Attending: Obstetrics and Gynecology | Admitting: Obstetrics and Gynecology

## 2021-07-25 DIAGNOSIS — Z1231 Encounter for screening mammogram for malignant neoplasm of breast: Secondary | ICD-10-CM

## 2021-11-02 DIAGNOSIS — E7800 Pure hypercholesterolemia, unspecified: Secondary | ICD-10-CM | POA: Diagnosis not present

## 2021-11-02 DIAGNOSIS — E559 Vitamin D deficiency, unspecified: Secondary | ICD-10-CM | POA: Diagnosis not present

## 2021-11-02 DIAGNOSIS — Z0000 Encounter for general adult medical examination without abnormal findings: Secondary | ICD-10-CM | POA: Diagnosis not present

## 2021-12-28 DIAGNOSIS — Z23 Encounter for immunization: Secondary | ICD-10-CM | POA: Diagnosis not present

## 2022-05-15 DIAGNOSIS — H5203 Hypermetropia, bilateral: Secondary | ICD-10-CM | POA: Diagnosis not present

## 2022-05-15 DIAGNOSIS — H2513 Age-related nuclear cataract, bilateral: Secondary | ICD-10-CM | POA: Diagnosis not present

## 2022-05-22 DIAGNOSIS — M8580 Other specified disorders of bone density and structure, unspecified site: Secondary | ICD-10-CM | POA: Diagnosis not present

## 2022-05-22 DIAGNOSIS — Z01419 Encounter for gynecological examination (general) (routine) without abnormal findings: Secondary | ICD-10-CM | POA: Diagnosis not present

## 2022-05-22 DIAGNOSIS — Z1151 Encounter for screening for human papillomavirus (HPV): Secondary | ICD-10-CM | POA: Diagnosis not present

## 2022-05-22 DIAGNOSIS — Z124 Encounter for screening for malignant neoplasm of cervix: Secondary | ICD-10-CM | POA: Diagnosis not present

## 2022-05-24 ENCOUNTER — Other Ambulatory Visit: Payer: Self-pay | Admitting: Obstetrics and Gynecology

## 2022-05-24 DIAGNOSIS — M8580 Other specified disorders of bone density and structure, unspecified site: Secondary | ICD-10-CM

## 2022-06-22 ENCOUNTER — Other Ambulatory Visit: Payer: Self-pay | Admitting: Obstetrics and Gynecology

## 2022-06-22 DIAGNOSIS — Z1231 Encounter for screening mammogram for malignant neoplasm of breast: Secondary | ICD-10-CM

## 2022-08-01 ENCOUNTER — Ambulatory Visit
Admission: RE | Admit: 2022-08-01 | Discharge: 2022-08-01 | Disposition: A | Discharge status: Home / Self Care | Payer: Medicare HMO | Source: Ambulatory Visit | Attending: Obstetrics and Gynecology | Admitting: Obstetrics and Gynecology

## 2022-08-01 DIAGNOSIS — Z1231 Encounter for screening mammogram for malignant neoplasm of breast: Secondary | ICD-10-CM | POA: Diagnosis not present

## 2022-10-22 DIAGNOSIS — L821 Other seborrheic keratosis: Secondary | ICD-10-CM | POA: Diagnosis not present

## 2022-10-22 DIAGNOSIS — D229 Melanocytic nevi, unspecified: Secondary | ICD-10-CM | POA: Diagnosis not present

## 2022-10-22 DIAGNOSIS — L814 Other melanin hyperpigmentation: Secondary | ICD-10-CM | POA: Diagnosis not present

## 2022-10-23 DIAGNOSIS — Z6828 Body mass index (BMI) 28.0-28.9, adult: Secondary | ICD-10-CM | POA: Diagnosis not present

## 2022-10-23 DIAGNOSIS — M8580 Other specified disorders of bone density and structure, unspecified site: Secondary | ICD-10-CM | POA: Diagnosis not present

## 2022-10-23 DIAGNOSIS — F840 Autistic disorder: Secondary | ICD-10-CM | POA: Diagnosis not present

## 2022-10-23 DIAGNOSIS — E7800 Pure hypercholesterolemia, unspecified: Secondary | ICD-10-CM | POA: Diagnosis not present

## 2022-10-23 DIAGNOSIS — Z23 Encounter for immunization: Secondary | ICD-10-CM | POA: Diagnosis not present

## 2022-10-23 DIAGNOSIS — E559 Vitamin D deficiency, unspecified: Secondary | ICD-10-CM | POA: Diagnosis not present

## 2022-10-23 DIAGNOSIS — Z0000 Encounter for general adult medical examination without abnormal findings: Secondary | ICD-10-CM | POA: Diagnosis not present

## 2022-10-23 DIAGNOSIS — I10 Essential (primary) hypertension: Secondary | ICD-10-CM | POA: Diagnosis not present

## 2022-11-23 ENCOUNTER — Other Ambulatory Visit: Payer: Medicare HMO

## 2022-11-29 DIAGNOSIS — E559 Vitamin D deficiency, unspecified: Secondary | ICD-10-CM | POA: Diagnosis not present

## 2022-11-29 DIAGNOSIS — I10 Essential (primary) hypertension: Secondary | ICD-10-CM | POA: Diagnosis not present

## 2022-11-29 DIAGNOSIS — Z6828 Body mass index (BMI) 28.0-28.9, adult: Secondary | ICD-10-CM | POA: Diagnosis not present

## 2022-11-29 DIAGNOSIS — E7800 Pure hypercholesterolemia, unspecified: Secondary | ICD-10-CM | POA: Diagnosis not present

## 2022-12-18 ENCOUNTER — Ambulatory Visit
Admission: RE | Admit: 2022-12-18 | Discharge: 2022-12-18 | Disposition: A | Discharge status: Home / Self Care | Payer: Medicare HMO | Source: Ambulatory Visit | Attending: Obstetrics and Gynecology | Admitting: Obstetrics and Gynecology

## 2022-12-18 DIAGNOSIS — M8588 Other specified disorders of bone density and structure, other site: Secondary | ICD-10-CM | POA: Diagnosis not present

## 2022-12-18 DIAGNOSIS — M8580 Other specified disorders of bone density and structure, unspecified site: Secondary | ICD-10-CM

## 2022-12-18 DIAGNOSIS — E349 Endocrine disorder, unspecified: Secondary | ICD-10-CM | POA: Diagnosis not present

## 2022-12-18 DIAGNOSIS — N958 Other specified menopausal and perimenopausal disorders: Secondary | ICD-10-CM | POA: Diagnosis not present

## 2023-05-08 DIAGNOSIS — E559 Vitamin D deficiency, unspecified: Secondary | ICD-10-CM | POA: Diagnosis not present

## 2023-05-08 DIAGNOSIS — E7800 Pure hypercholesterolemia, unspecified: Secondary | ICD-10-CM | POA: Diagnosis not present

## 2023-05-08 DIAGNOSIS — Z6828 Body mass index (BMI) 28.0-28.9, adult: Secondary | ICD-10-CM | POA: Diagnosis not present

## 2023-05-08 DIAGNOSIS — I10 Essential (primary) hypertension: Secondary | ICD-10-CM | POA: Diagnosis not present

## 2023-05-16 DIAGNOSIS — H2513 Age-related nuclear cataract, bilateral: Secondary | ICD-10-CM | POA: Diagnosis not present

## 2023-05-16 DIAGNOSIS — H524 Presbyopia: Secondary | ICD-10-CM | POA: Diagnosis not present

## 2023-06-04 DIAGNOSIS — L900 Lichen sclerosus et atrophicus: Secondary | ICD-10-CM | POA: Diagnosis not present

## 2023-06-04 DIAGNOSIS — Z01419 Encounter for gynecological examination (general) (routine) without abnormal findings: Secondary | ICD-10-CM | POA: Diagnosis not present

## 2023-06-27 DIAGNOSIS — Z6828 Body mass index (BMI) 28.0-28.9, adult: Secondary | ICD-10-CM | POA: Diagnosis not present

## 2023-06-27 DIAGNOSIS — I10 Essential (primary) hypertension: Secondary | ICD-10-CM | POA: Diagnosis not present

## 2023-07-15 ENCOUNTER — Other Ambulatory Visit: Payer: Self-pay | Admitting: Obstetrics and Gynecology

## 2023-07-15 DIAGNOSIS — Z0000 Encounter for general adult medical examination without abnormal findings: Secondary | ICD-10-CM

## 2023-07-23 ENCOUNTER — Encounter

## 2023-07-23 DIAGNOSIS — Z1231 Encounter for screening mammogram for malignant neoplasm of breast: Secondary | ICD-10-CM

## 2023-08-26 ENCOUNTER — Ambulatory Visit

## 2023-09-11 ENCOUNTER — Ambulatory Visit
Admission: RE | Admit: 2023-09-11 | Discharge: 2023-09-11 | Disposition: A | Discharge status: Home / Self Care | Source: Ambulatory Visit | Attending: Obstetrics and Gynecology | Admitting: Obstetrics and Gynecology

## 2023-09-11 DIAGNOSIS — Z1231 Encounter for screening mammogram for malignant neoplasm of breast: Secondary | ICD-10-CM | POA: Diagnosis not present

## 2023-09-11 DIAGNOSIS — Z0000 Encounter for general adult medical examination without abnormal findings: Secondary | ICD-10-CM

## 2023-09-26 DIAGNOSIS — I10 Essential (primary) hypertension: Secondary | ICD-10-CM | POA: Diagnosis not present

## 2023-10-21 DIAGNOSIS — E7800 Pure hypercholesterolemia, unspecified: Secondary | ICD-10-CM | POA: Diagnosis not present

## 2023-10-21 DIAGNOSIS — E559 Vitamin D deficiency, unspecified: Secondary | ICD-10-CM | POA: Diagnosis not present

## 2023-10-24 DIAGNOSIS — N951 Menopausal and female climacteric states: Secondary | ICD-10-CM | POA: Diagnosis not present

## 2023-10-24 DIAGNOSIS — F840 Autistic disorder: Secondary | ICD-10-CM | POA: Diagnosis not present

## 2023-10-24 DIAGNOSIS — I10 Essential (primary) hypertension: Secondary | ICD-10-CM | POA: Diagnosis not present

## 2023-10-24 DIAGNOSIS — E559 Vitamin D deficiency, unspecified: Secondary | ICD-10-CM | POA: Diagnosis not present

## 2023-10-24 DIAGNOSIS — Z6828 Body mass index (BMI) 28.0-28.9, adult: Secondary | ICD-10-CM | POA: Diagnosis not present

## 2023-10-24 DIAGNOSIS — E7800 Pure hypercholesterolemia, unspecified: Secondary | ICD-10-CM | POA: Diagnosis not present

## 2023-10-24 DIAGNOSIS — Z0000 Encounter for general adult medical examination without abnormal findings: Secondary | ICD-10-CM | POA: Diagnosis not present

## 2023-10-24 DIAGNOSIS — M8580 Other specified disorders of bone density and structure, unspecified site: Secondary | ICD-10-CM | POA: Diagnosis not present

## 2024-01-17 DIAGNOSIS — Z6828 Body mass index (BMI) 28.0-28.9, adult: Secondary | ICD-10-CM | POA: Diagnosis not present

## 2024-01-17 DIAGNOSIS — I10 Essential (primary) hypertension: Secondary | ICD-10-CM | POA: Diagnosis not present

## 2024-01-21 DIAGNOSIS — Z8249 Family history of ischemic heart disease and other diseases of the circulatory system: Secondary | ICD-10-CM | POA: Diagnosis not present

## 2024-01-21 DIAGNOSIS — I10 Essential (primary) hypertension: Secondary | ICD-10-CM | POA: Diagnosis not present

## 2024-01-21 DIAGNOSIS — Z5982 Transportation insecurity: Secondary | ICD-10-CM | POA: Diagnosis not present

## 2024-01-21 DIAGNOSIS — E785 Hyperlipidemia, unspecified: Secondary | ICD-10-CM | POA: Diagnosis not present

## 2024-01-21 DIAGNOSIS — M8580 Other specified disorders of bone density and structure, unspecified site: Secondary | ICD-10-CM | POA: Diagnosis not present

## 2024-01-21 DIAGNOSIS — F840 Autistic disorder: Secondary | ICD-10-CM | POA: Diagnosis not present

## 2024-01-21 DIAGNOSIS — Z833 Family history of diabetes mellitus: Secondary | ICD-10-CM | POA: Diagnosis not present

## 2024-01-31 DIAGNOSIS — Z6828 Body mass index (BMI) 28.0-28.9, adult: Secondary | ICD-10-CM | POA: Diagnosis not present

## 2024-01-31 DIAGNOSIS — I10 Essential (primary) hypertension: Secondary | ICD-10-CM | POA: Diagnosis not present
